# Patient Record
Sex: Female | Born: 1990 | Race: White | Hispanic: No | Marital: Single | Smoking: Never smoker
Health system: Southern US, Community
[De-identification: ages and names within clinical notes are randomized; demographics above are authoritative.]

## PROBLEM LIST (undated history)

## (undated) ENCOUNTER — Emergency Department (HOSPITAL_BASED_OUTPATIENT_CLINIC_OR_DEPARTMENT_OTHER): Admission: EM | Payer: Self-pay

## (undated) HISTORY — PX: WISDOM TOOTH EXTRACTION: SHX21

---

## 2014-08-07 ENCOUNTER — Emergency Department (HOSPITAL_COMMUNITY)
Admission: EM | Admit: 2014-08-07 | Discharge: 2014-08-07 | Disposition: A | Discharge status: Home / Self Care | Payer: Self-pay | Attending: Emergency Medicine | Admitting: Emergency Medicine

## 2014-08-07 ENCOUNTER — Encounter (HOSPITAL_COMMUNITY): Payer: Self-pay

## 2014-08-07 ENCOUNTER — Emergency Department (HOSPITAL_COMMUNITY): Payer: Self-pay

## 2014-08-07 DIAGNOSIS — B9789 Other viral agents as the cause of diseases classified elsewhere: Secondary | ICD-10-CM

## 2014-08-07 DIAGNOSIS — J069 Acute upper respiratory infection, unspecified: Secondary | ICD-10-CM | POA: Insufficient documentation

## 2014-08-07 DIAGNOSIS — Z79899 Other long term (current) drug therapy: Secondary | ICD-10-CM | POA: Insufficient documentation

## 2014-08-07 MED ORDER — GUAIFENESIN ER 1200 MG PO TB12
1.0000 | ORAL_TABLET | Freq: Two times a day (BID) | ORAL | Status: AC
Start: 2014-08-07 — End: ?

## 2014-08-07 MED ORDER — DOXYCYCLINE HYCLATE 100 MG PO CAPS: 100.0000 mg | ORAL_CAPSULE | Freq: Two times a day (BID) | ORAL | Status: AC

## 2014-08-07 MED ORDER — PREDNISONE 50 MG PO TABS: 50.0000 mg | ORAL_TABLET | Freq: Every day | ORAL | Status: AC

## 2014-08-07 MED ORDER — PROMETHAZINE-DM 6.25-15 MG/5ML PO SYRP: 5.0000 mL | ORAL_SOLUTION | Freq: Four times a day (QID) | ORAL | Status: AC | PRN

## 2014-08-07 NOTE — Discharge Instructions (Signed)
Return here as needed. Follow up with a primary doctor. Increase your fluid intake. °

## 2014-08-07 NOTE — ED Notes (Signed)
Pt with cough x 1 1/2 weeks.  ? Fever.  Productive cough with occasional red tinge sputum.  Nasal congestion.

## 2014-08-07 NOTE — ED Provider Notes (Signed)
CSN: [id number]     Arrival date & time 08/07/14  1734 History  This chart was scribed for non-physician practitioner, Charlestine Night, PA-C working with Richardean Canal, MD by Gwenyth Ober, ED scribe. This patient was seen in room WTR7/WTR7 and the patient's care was started at 6:21 PM    Chief Complaint  Patient presents with  . Cough    The history is provided by the patient. No language interpreter was used.    HPI Comments: Peggy Rocha is a 24 y.o. female with no chronic medical conditions who presents to the Emergency Department complaining of intermittent productive cough that started 2 weeks ago. She states a few occurences of hemoptysis that occurred last night as an associated symptom. Pt also notes an intermittent metallic taste presenting with cough today. She denies tobacco use.   History reviewed. No pertinent past medical history. Past Surgical History  Procedure Laterality Date  . Wisdom tooth extraction     History reviewed. No pertinent family history. History  Substance Use Topics  . Smoking status: Never Smoker   . Smokeless tobacco: Not on file  . Alcohol Use: Yes     Comment: social   OB History    No data available     Review of Systems  A complete 10 system review of systems was obtained and all systems are negative except as noted in the HPI and PMH.    Allergies  Review of patient's allergies indicates no known allergies.  Home Medications   Prior to Admission medications   Medication Sig Start Date End Date Taking? Authorizing Provider  BIOTIN FORTE PO Take 1 tablet by mouth daily.   Yes Historical Provider, MD  ibuprofen (ADVIL,MOTRIN) 200 MG tablet Take 800 mg by mouth every 6 (six) hours as needed for moderate pain.   Yes Historical Provider, MD  Multiple Vitamin (MULTIVITAMIN WITH MINERALS) TABS tablet Take 1 tablet by mouth daily.   Yes Historical Provider, MD   BP 139/96 mmHg  Pulse 86  Temp(Src) 99.1 F (37.3 C) (Oral)  Resp 20   SpO2 100%  LMP 06/30/2014 (Approximate) Physical Exam  Constitutional: She is oriented to person, place, and time. She appears well-developed and well-nourished. No distress.  HENT:  Head: Normocephalic and atraumatic.  Mouth/Throat: Oropharynx is clear and moist.  Eyes: Pupils are equal, round, and reactive to light.  Neck: Normal range of motion. Neck supple. No tracheal deviation present.  Cardiovascular: Normal rate, regular rhythm and normal heart sounds.  Exam reveals no gallop and no friction rub.   No murmur heard. Pulmonary/Chest: Effort normal and breath sounds normal. No respiratory distress. She has no wheezes.  Neurological: She is alert and oriented to person, place, and time. Coordination normal.  Skin: Skin is warm and dry. No rash noted. No erythema.  Psychiatric: She has a normal mood and affect. Her behavior is normal.  Nursing note and vitals reviewed.   ED Course  Procedures  DIAGNOSTIC STUDIES: Oxygen Saturation is 100% on RA, normal by my interpretation.    COORDINATION OF CARE: 6:23 PM Discussed treatment plan with pt at bedside and pt agreed to plan.   Imaging Review Dg Chest 2 View  08/07/2014   CLINICAL DATA:  Hemoptysis last night.  EXAM: CHEST  2 VIEW  COMPARISON:  None.  FINDINGS: The heart size and mediastinal contours are within normal limits. Both lungs are clear. The visualized skeletal structures are unremarkable.  IMPRESSION: No active cardiopulmonary disease.   Electronically  Signed   By: Richarda OverlieAdam  Henn M.D.   On: 08/07/2014 18:22    She will be treated for a viral upper respiratory cough.  She states she has had several episodes of coughing up some blood.  We will have her follow-up with her primary care Dr. told to return here as needed.  The chest x-ray did not show any acute abnormality   Carlyle DollyChristopher W Taiana Temkin, PA-C 08/07/14 1830  Richardean Canalavid H Yao, MD 08/07/14 [phone]

## 2017-07-29 ENCOUNTER — Other Ambulatory Visit: Payer: Self-pay | Admitting: Occupational Medicine

## 2017-07-29 ENCOUNTER — Ambulatory Visit: Payer: Self-pay

## 2017-07-29 DIAGNOSIS — M25532 Pain in left wrist: Secondary | ICD-10-CM

## 2018-09-05 IMAGING — DX DG WRIST COMPLETE 3+V*L*
4 series · 4 of 4 positions shown · non-contrast
Comparison: No recent prior.

CLINICAL DATA: Evaluation of ganglion cyst.

EXAM:
LEFT WRIST - COMPLETE 3+ VIEW

[wrist pa]
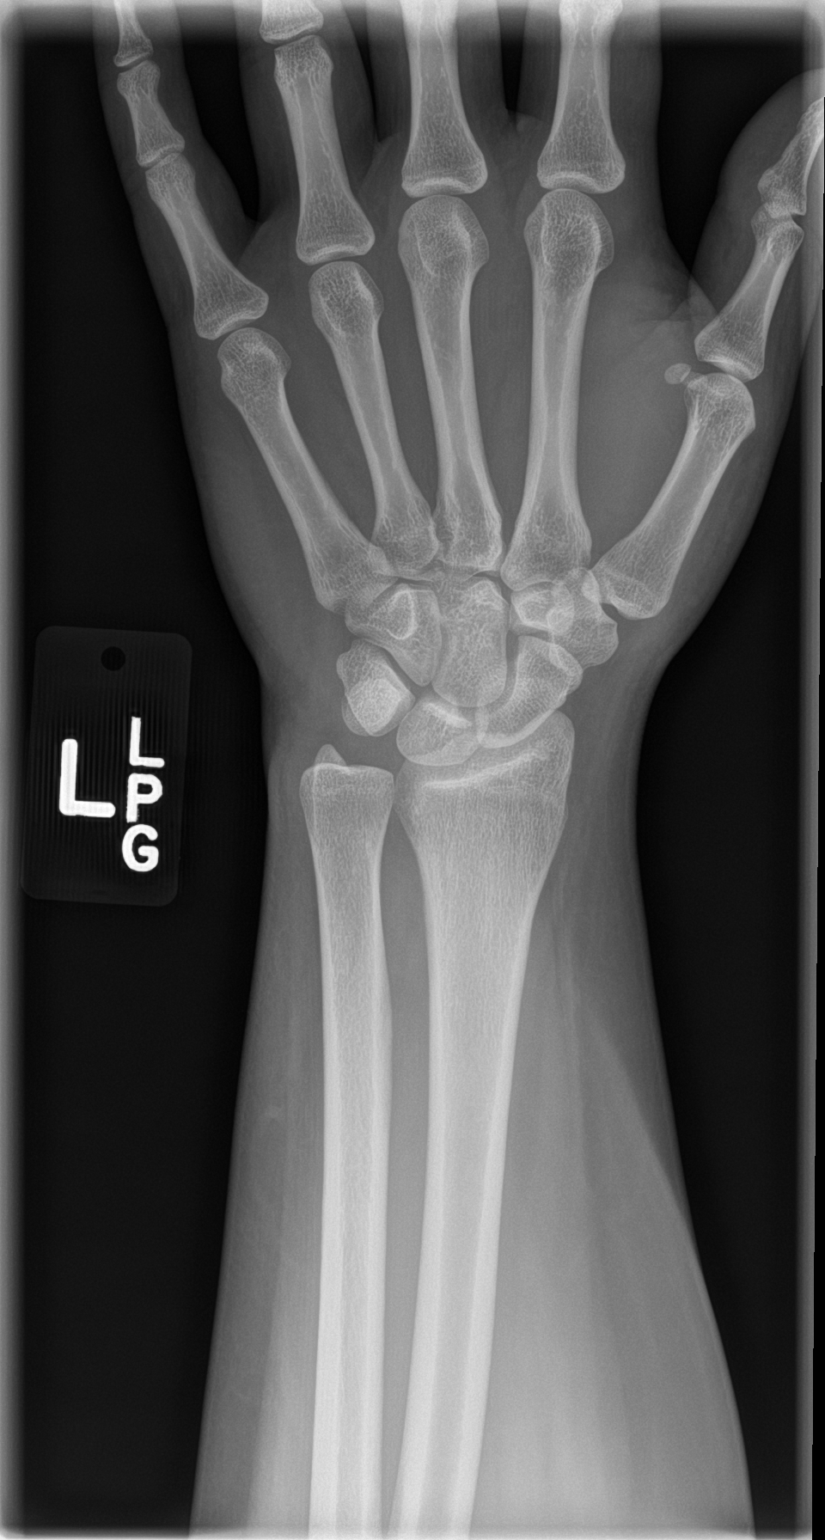

[wrist obl]
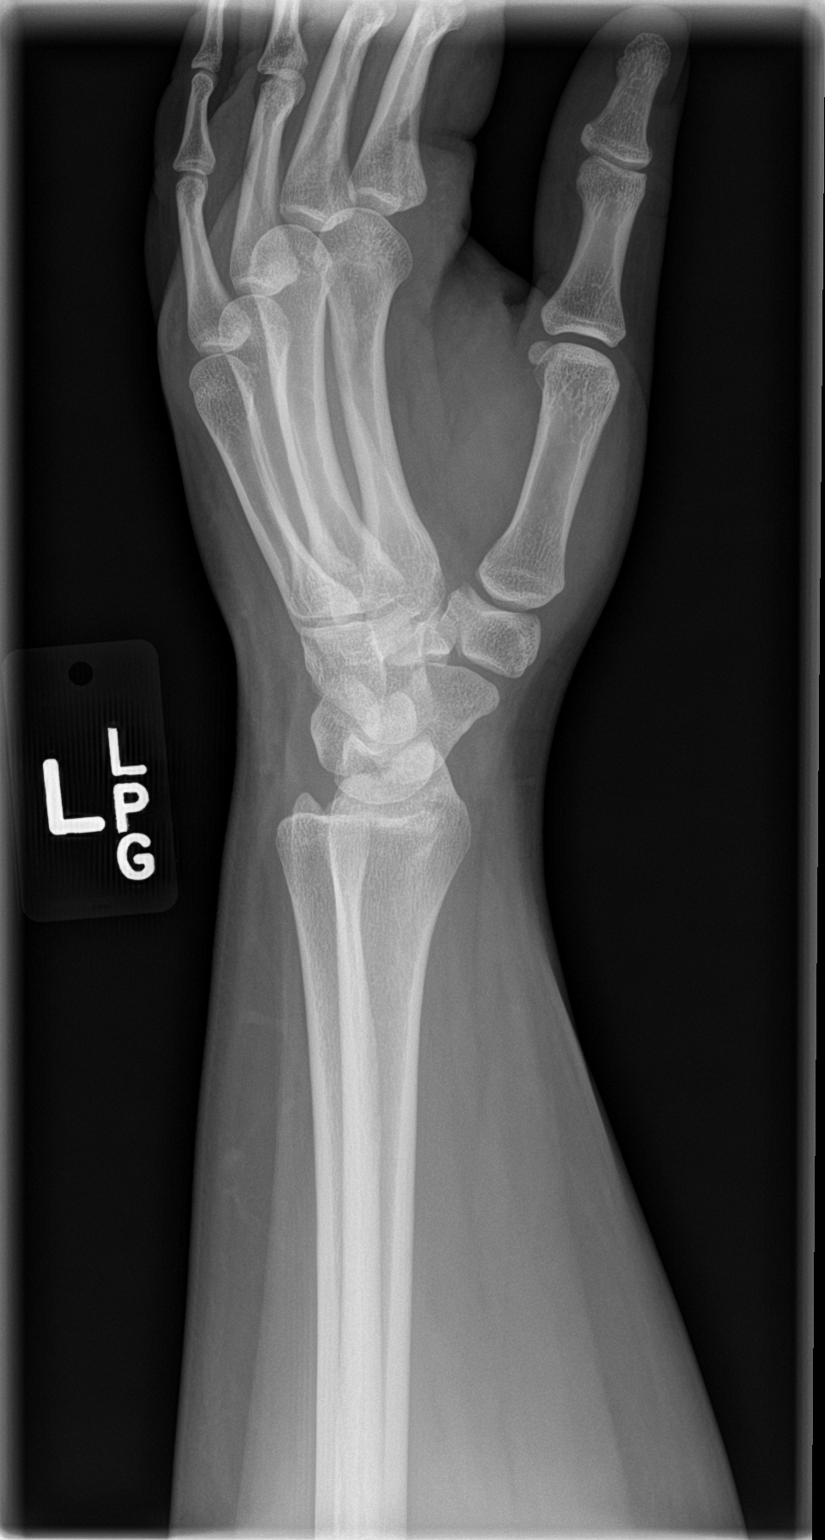

[wrist lat]
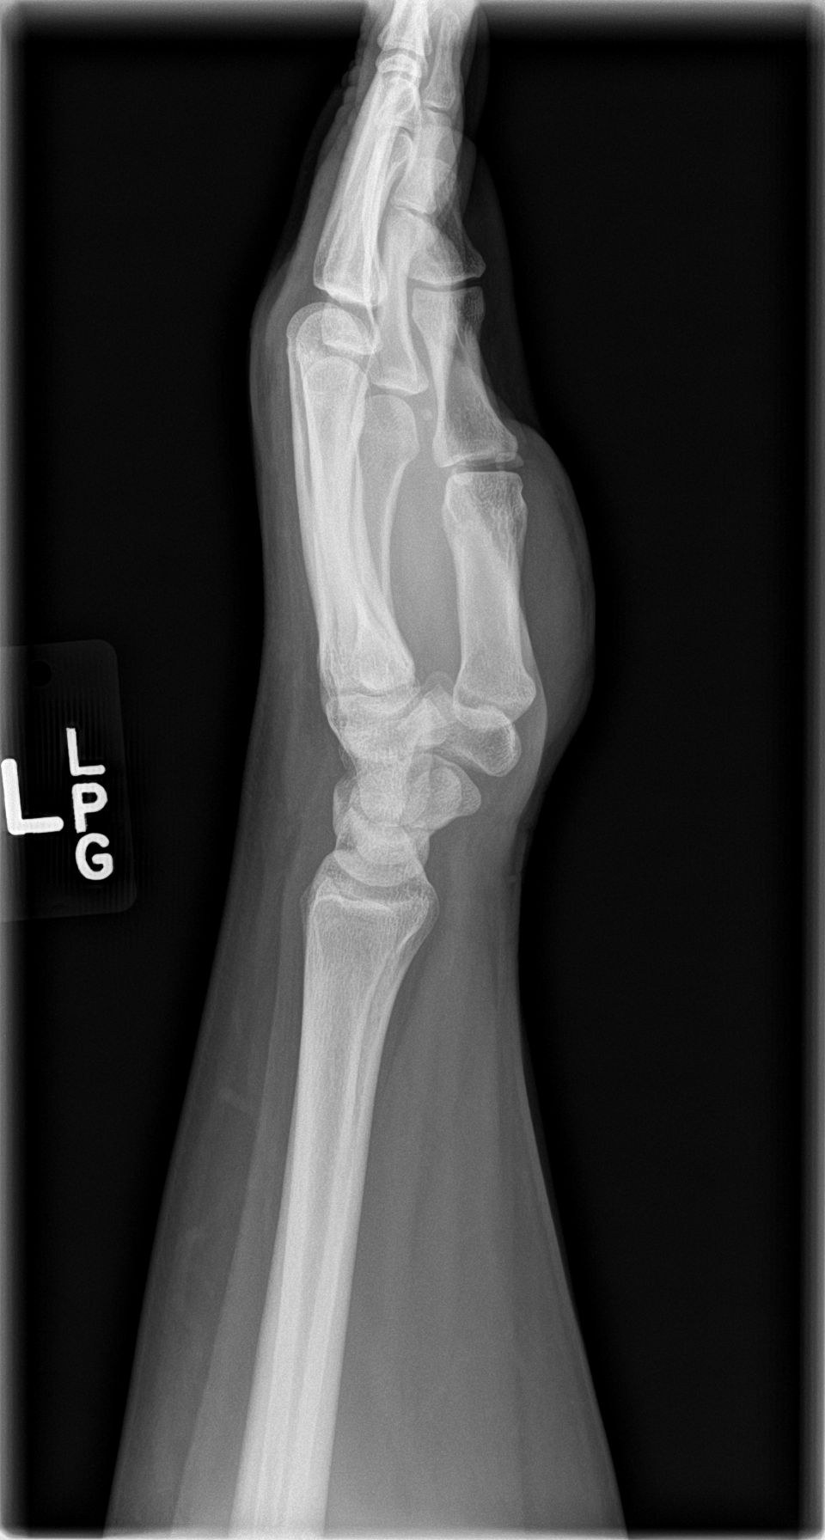

[wrist navicular]
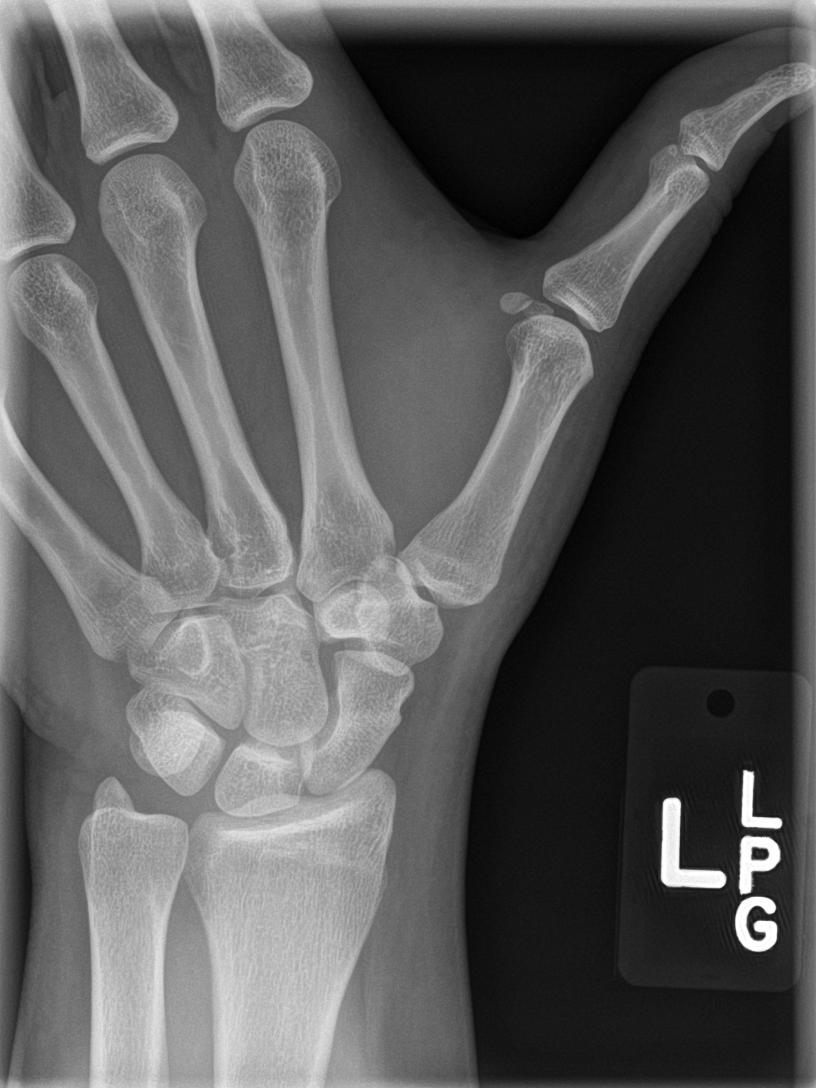

[4 of 4 positions shown; findings below may reference images not displayed]

FINDINGS: No acute bony or joint abnormality identified. No evidence of
fracture or dislocation.
IMPRESSION: No acute bony or joint abnormality.

## 2022-06-16 ENCOUNTER — Other Ambulatory Visit: Payer: Self-pay
